# Patient Record
Sex: Female | Born: 2000 | Race: White | Hispanic: No | Marital: Single | Smoking: Current some day smoker
Health system: Southern US, Community
[De-identification: ages and names within clinical notes are randomized; demographics above are authoritative.]

## PROBLEM LIST (undated history)

## (undated) DIAGNOSIS — J45909 Unspecified asthma, uncomplicated: Secondary | ICD-10-CM

## (undated) HISTORY — PX: WISDOM TOOTH EXTRACTION: SHX21

---
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
## 2020-07-12 ENCOUNTER — Emergency Department
Admission: EM | Admit: 2020-07-12 | Discharge: 2020-07-12 | Disposition: A | Discharge status: Home / Self Care | Payer: No Typology Code available for payment source | Source: Home / Self Care

## 2020-07-12 ENCOUNTER — Emergency Department (INDEPENDENT_AMBULATORY_CARE_PROVIDER_SITE_OTHER): Payer: No Typology Code available for payment source

## 2020-07-12 ENCOUNTER — Encounter: Payer: Self-pay | Admitting: Emergency Medicine

## 2020-07-12 ENCOUNTER — Other Ambulatory Visit: Payer: Self-pay

## 2020-07-12 DIAGNOSIS — J069 Acute upper respiratory infection, unspecified: Secondary | ICD-10-CM

## 2020-07-12 DIAGNOSIS — R059 Cough, unspecified: Secondary | ICD-10-CM | POA: Diagnosis not present

## 2020-07-12 DIAGNOSIS — R062 Wheezing: Secondary | ICD-10-CM | POA: Diagnosis not present

## 2020-07-12 DIAGNOSIS — R0602 Shortness of breath: Secondary | ICD-10-CM

## 2020-07-12 HISTORY — DX: Unspecified asthma, uncomplicated: J45.909

## 2020-07-12 MED ORDER — DEXAMETHASONE SODIUM PHOSPHATE 10 MG/ML IJ SOLN
10.0000 mg | Freq: Once | INTRAMUSCULAR | Status: AC
  Administered 2020-07-12: 10 mg via INTRAMUSCULAR

## 2020-07-12 MED ORDER — PREDNISONE 10 MG (21) PO TBPK: ORAL_TABLET | Freq: Every day | ORAL | 0 refills | Status: AC

## 2020-07-12 MED ORDER — ALBUTEROL SULFATE HFA 108 (90 BASE) MCG/ACT IN AERS
2.0000 | INHALATION_SPRAY | Freq: Once | RESPIRATORY_TRACT | Status: AC
  Administered 2020-07-12: 2 via RESPIRATORY_TRACT

## 2020-07-12 MED ORDER — PROMETHAZINE-DM 6.25-15 MG/5ML PO SYRP: 5.0000 mL | ORAL_SOLUTION | Freq: Four times a day (QID) | ORAL | 0 refills | Status: AC | PRN

## 2020-07-12 NOTE — ED Provider Notes (Signed)
[facility]   [id number] 07/12/20 Arrival Time: 1524   CC: COVID symptoms  SUBJECTIVE: History from: patient.  Janice West is a 20 y.o. female who presents with cough x 1 week, SOB. Reports that her sister was sick a week ago and that she thinks she has the same thing. Denies sick exposure to COVID, flu or strep. Denies recent travel. Has positive history of Covid in 04/2019. Has not completed Covid vaccines. Has been taking mucinex with little relief. Reports that she has asthma but is out of albuterol at home. Symptoms are made worse with activity. Denies previous symptoms in the past. Denies fever, chills, fatigue, sinus pain, rhinorrhea, sore throat, wheezing, chest pain, nausea, changes in bowel or bladder habits.    ROS: As per HPI.  All other pertinent ROS negative.     Past Medical History:  Diagnosis Date  . Asthma    Past Surgical History:  Procedure Laterality Date  . WISDOM TOOTH EXTRACTION     No Known Allergies No current facility-administered medications on file prior to encounter.   Current Outpatient Medications on File Prior to Encounter  Medication Sig Dispense Refill  . levonorgestrel (MIRENA) 20 MCG/24HR IUD by Intrauterine route.     Social History   Socioeconomic History  . Marital status: Single    Spouse name: Not on file  . Number of children: Not on file  . Years of education: Not on file  . Highest education level: Not on file  Occupational History  . Not on file  Tobacco Use  . Smoking status: Current Some Day Smoker    Types: E-cigarettes  . Smokeless tobacco: Never Used  Vaping Use  . Vaping Use: Some days  . Substances: Nicotine  Substance and Sexual Activity  . Alcohol use: Not Currently  . Drug use: Yes    Types: Marijuana    Comment: 2 gms /week  . Sexual activity: Not Currently    Birth control/protection: I.U.D.  Other Topics Concern  . Not on file  Social History Narrative  . Not on file   Social Determinants  of Health   Financial Resource Strain: Not on file  Food Insecurity: Not on file  Transportation Needs: Not on file  Physical Activity: Not on file  Stress: Not on file  Social Connections: Not on file  Intimate Partner Violence: Not on file   Family History  Problem Relation Age of Onset  . Healthy Mother   . Healthy Father   . Healthy Sister   . Healthy Brother     OBJECTIVE:  Vitals:   07/12/20 1539 07/12/20 1544  BP: 132/89   Pulse: 80   Resp: 16   Temp: 99.1 F (37.3 C)   TempSrc: Oral   SpO2: 97%   Weight:  95 lb (43.1 kg)  Height:  5\' 3"  (1.6 m)     General appearance: alert; appears fatigued, but nontoxic; speaking in full sentences and tolerating own secretions HEENT: NCAT; Ears: EACs clear, TMs pearly gray; Eyes: PERRL.  EOM grossly intact. Sinuses: nontender; Nose: nares patent with clear rhinorrhea, Throat: oropharynx erythematous, cobblestoning present, tonsils non erythematous or enlarged, uvula midline  Neck: supple without LAD Lungs: unlabored respirations, symmetrical air entry; cough: mild; no respiratory distress; diffuse wheezing throughout bilateral lung fields Heart: regular rate and rhythm.  Radial pulses 2+ symmetrical bilaterally Skin: warm and dry Psychological: alert and cooperative; normal mood and affect  LABS:  No results found for this or any previous  visit (from the past 24 hour(s)).   ASSESSMENT & PLAN:  1. Viral URI with cough   2. SOB (shortness of breath)   3. Wheezing     Meds ordered this encounter  Medications  . dexamethasone (DECADRON) injection 10 mg  . albuterol (VENTOLIN HFA) 108 (90 Base) MCG/ACT inhaler 2 puff  . promethazine-dextromethorphan (PROMETHAZINE-DM) 6.25-15 MG/5ML syrup    Sig: Take 5 mLs by mouth 4 (four) times daily as needed for cough.    Dispense:  118 mL    Refill:  0    Order Specific Question:   Supervising Provider    Answer:   Merrilee Jansky [id number]  . predniSONE (STERAPRED UNI-PAK 21  TAB) 10 MG (21) TBPK tablet    Sig: Take by mouth daily for 6 days. Take 6 tablets on day 1, 5 tablets on day 2, 4 tablets on day 3, 3 tablets on day 4, 2 tablets on day 5, 1 tablet on day 6    Dispense:  21 tablet    Refill:  0    Order Specific Question:   Supervising Provider    Answer:   Merrilee Jansky [[id number]]    Chest xray negative for pneumonia today Decadron 10mg  IM in office today Albuterol inhaler given in office today Promethazine cough syrup prescribed Sedation precautions given Steroid taper prescribed Continue supportive care at home Work/School note provided Get plenty of rest and push fluids Use OTC zyrtec for nasal congestion, runny nose, and/or sore throat Use OTC flonase for nasal congestion and runny nose Use medications daily for symptom relief Use OTC medications like ibuprofen or tylenol as needed fever or pain Call or go to the ED if you have any new or worsening symptoms such as fever, worsening cough, shortness of breath, chest tightness, chest pain, turning blue, changes in mental status.  Reviewed expectations re: course of current medical issues. Questions answered. Outlined signs and symptoms indicating need for more acute intervention. Patient verbalized understanding. After Visit Summary given.         , NP 07/12/20 1627

## 2020-07-12 NOTE — ED Triage Notes (Signed)
Cough x 1 week Sister was sick but is better now DOE per pt  Hx of asthma - no albuterol at home Mucinex daily No COVID vaccine  COVID 12/20

## 2020-07-12 NOTE — Discharge Instructions (Addendum)
Xray is negative for pneumonia today  I have sent in cough syrup for you to take. This medication can make you sleepy. Do not drive while taking this medication.  I have sent in a prednisone taper for you to take for 6 days. 6 tablets on day one, 5 tablets on day two, 4 tablets on day three, 3 tablets on day four, 2 tablets on day five, and 1 tablet on day six.  I have sent in an albuterol inhaler for you to use 2 puffs every 4-6 hours as needed for cough, shortness of breath, wheezing.  Follow up with this office or with primary care if symptoms are persisting.  Follow up in the ER for high fever, trouble swallowing, trouble breathing, other concerning symptoms.

## 2021-05-24 ENCOUNTER — Emergency Department
Admission: RE | Admit: 2021-05-24 | Discharge: 2021-05-24 | Disposition: A | Discharge status: Home / Self Care | Payer: No Typology Code available for payment source | Source: Ambulatory Visit | Attending: Family Medicine | Admitting: Family Medicine

## 2021-05-24 ENCOUNTER — Other Ambulatory Visit: Payer: Self-pay

## 2021-05-24 VITALS — BP 122/87 | HR 78 | Temp 99.0°F | Resp 14

## 2021-05-24 DIAGNOSIS — R062 Wheezing: Secondary | ICD-10-CM

## 2021-05-24 DIAGNOSIS — R059 Cough, unspecified: Secondary | ICD-10-CM | POA: Diagnosis not present

## 2021-05-24 DIAGNOSIS — J101 Influenza due to other identified influenza virus with other respiratory manifestations: Secondary | ICD-10-CM

## 2021-05-24 LAB — POC INFLUENZA A AND B ANTIGEN (URGENT CARE ONLY)
Influenza A Ag: POSITIVE — AB
Influenza B Ag: NEGATIVE

## 2021-05-24 MED ORDER — PREDNISONE 20 MG PO TABS: ORAL_TABLET | ORAL | 0 refills | Status: AC

## 2021-05-24 MED ORDER — BENZONATATE 200 MG PO CAPS: 200.0000 mg | ORAL_CAPSULE | Freq: Three times a day (TID) | ORAL | 0 refills | Status: AC | PRN

## 2021-05-24 MED ORDER — ALBUTEROL SULFATE HFA 108 (90 BASE) MCG/ACT IN AERS: 1.0000 | INHALATION_SPRAY | Freq: Four times a day (QID) | RESPIRATORY_TRACT | 0 refills | Status: AC | PRN

## 2021-05-24 MED ORDER — OSELTAMIVIR PHOSPHATE 75 MG PO CAPS: 75.0000 mg | ORAL_CAPSULE | Freq: Two times a day (BID) | ORAL | 0 refills | Status: AC

## 2021-05-24 NOTE — ED Provider Notes (Signed)
[facility]    CSN: [id number] Arrival date & time: 05/24/21  1435      History   Chief Complaint Chief Complaint  Patient presents with   Cough   Nasal Congestion    HPI Janice West is a 20 y.o. female.   HPI 20 year old female presents with cough, congestion, body aches that began yesterday and reports positive flu exposure.  PMH significant for asthma.  Past Medical History:  Diagnosis Date   Asthma     Patient Active Problem List   Diagnosis Date Noted   Allergic rhinitis 04/13/2009    Past Surgical History:  Procedure Laterality Date   WISDOM TOOTH EXTRACTION      OB History   No obstetric history on file.      Home Medications    Prior to Admission medications   Medication Sig Start Date End Date Taking? Authorizing Provider  albuterol (VENTOLIN HFA) 108 (90 Base) MCG/ACT inhaler Inhale 1-2 puffs into the lungs every 6 (six) hours as needed for wheezing or shortness of breath. 05/24/21  Yes Trevor Iha, FNP  benzonatate (TESSALON) 200 MG capsule Take 1 capsule (200 mg total) by mouth 3 (three) times daily as needed for up to 7 days for cough. 05/24/21 05/31/21 Yes Trevor Iha, FNP  cetirizine (ZYRTEC) 10 MG tablet Take 10 mg by mouth daily.   Yes [provider]  oseltamivir (TAMIFLU) 75 MG capsule Take 1 capsule (75 mg total) by mouth every 12 (twelve) hours. 05/24/21  Yes Trevor Iha, FNP  predniSONE (DELTASONE) 20 MG tablet Take 2 tabs PO daily x 5 days. 05/24/21  Yes Trevor Iha, FNP  levonorgestrel (MIRENA) 20 MCG/24HR IUD by Intrauterine route.    [provider]  promethazine-dextromethorphan (PROMETHAZINE-DM) 6.25-15 MG/5ML syrup Take 5 mLs by mouth 4 (four) times daily as needed for cough. Patient not taking: Reported on 05/24/2021 07/12/20   Moshe Cipro, NP    Family History Family History  Problem Relation Age of Onset   Healthy Mother    Healthy Father    Healthy Sister    Healthy Brother      Social History Social History   Tobacco Use   Smoking status: Some Days    Types: E-cigarettes   Smokeless tobacco: Never  Vaping Use   Vaping Use: Some days   Substances: Nicotine  Substance Use Topics   Alcohol use: Not Currently   Drug use: Yes    Types: Marijuana    Comment: 2 gms /week     Allergies   Patient has no known allergies.   Review of Systems Review of Systems  HENT:  Positive for congestion.   Respiratory:  Positive for cough.   Musculoskeletal:  Positive for myalgias.  All other systems reviewed and are negative.   Physical Exam Triage Vital Signs ED Triage Vitals  Enc Vitals Group     BP 05/24/21 1528 122/87     Pulse Rate 05/24/21 1528 78     Resp 05/24/21 1528 14     Temp 05/24/21 1528 99 F (37.2 C)     Temp Source 05/24/21 1528 Oral     SpO2 05/24/21 1528 99 %     Weight --      Height --      Head Circumference --      Peak Flow --      Pain Score 05/24/21 1530 2     Pain Loc --      Pain Edu? --  Excl. in GC? --    No data found.  Updated Vital Signs BP 122/87 (BP Location: Left Arm)    Pulse 78    Temp 99 F (37.2 C) (Oral)    Resp 14    SpO2 99%    Physical Exam Vitals and nursing note reviewed.  Constitutional:      General: She is not in acute distress.    Appearance: Normal appearance. She is normal weight. She is ill-appearing.  HENT:     Right Ear: Tympanic membrane, ear canal and external ear normal.     Left Ear: Tympanic membrane, ear canal and external ear normal.     Mouth/Throat:     Mouth: Mucous membranes are moist.     Pharynx: Oropharynx is clear.  Eyes:     Extraocular Movements: Extraocular movements intact.     Conjunctiva/sclera: Conjunctivae normal.     Pupils: Pupils are equal, round, and reactive to light.  Cardiovascular:     Rate and Rhythm: Normal rate and regular rhythm.     Pulses: Normal pulses.     Heart sounds: Normal heart sounds.  Pulmonary:     Effort: Pulmonary effort is  normal.     Breath sounds: Wheezing present. No rhonchi or rales.  Musculoskeletal:     Cervical back: Normal range of motion and neck supple. Tenderness present.  Lymphadenopathy:     Cervical: Cervical adenopathy present.  Skin:    General: Skin is warm and dry.  Neurological:     General: No focal deficit present.     Mental Status: She is alert and oriented to person, place, and time.     UC Treatments / Results  Labs (all labs ordered are listed, but only abnormal results are displayed) Labs Reviewed  POC INFLUENZA A AND B ANTIGEN (URGENT CARE ONLY) - Abnormal; Notable for the following components:      Result Value   Influenza A Ag Positive (*)    All other components within normal limits    EKG   Radiology No results found.  Procedures Procedures (including critical care time)  Medications Ordered in UC Medications - No data to display  Initial Impression / Assessment and Plan / UC Course  I have reviewed the triage vital signs and the nursing notes.  Pertinent labs & imaging results that were available during my care of the patient were reviewed by me and considered in my medical decision making (see chart for details).     MDM: 1.  Influenza A-Rx Tamiflu; 2.  Wheezes-Rx'd Prednisone and Albuterol inhaler (added per patient request PMH of asthma); 3.  Cough-Tessalon Perles. Advised patient to take medication with food to completion.  Advised patient to take prednisone with first dose of Tamiflu for the next 5 days.  Advised may use Tessalon Perles daily or as needed for cough.  Encouraged patient to increase daily water intake while taking these medications.  Work note provided per patient request.  Patient discharged home, hemodynamically stable. Final Clinical Impressions(s) / UC Diagnoses   Final diagnoses:  Influenza A  Cough, unspecified type  Wheezes     Discharge Instructions      Advised patient to take medication with food to completion.   Advised patient to take prednisone with first dose of Tamiflu for the next 5 days.  Advised may use Tessalon Perles daily or as needed for cough.  Encouraged patient to increase daily water intake while taking these medications.  ED Prescriptions     Medication Sig Dispense Auth. Provider   oseltamivir (TAMIFLU) 75 MG capsule Take 1 capsule (75 mg total) by mouth every 12 (twelve) hours. 10 capsule Trevor Iha, FNP   predniSONE (DELTASONE) 20 MG tablet Take 2 tabs PO daily x 5 days. 10 tablet Trevor Iha, FNP   benzonatate (TESSALON) 200 MG capsule Take 1 capsule (200 mg total) by mouth 3 (three) times daily as needed for up to 7 days for cough. 40 capsule Trevor Iha, FNP   albuterol (VENTOLIN HFA) 108 (90 Base) MCG/ACT inhaler Inhale 1-2 puffs into the lungs every 6 (six) hours as needed for wheezing or shortness of breath. 1 each Trevor Iha, FNP      PDMP not reviewed this encounter.   Trevor Iha, FNP 05/24/21 1708

## 2021-05-24 NOTE — Discharge Instructions (Addendum)
Advised patient to take medication with food to completion.  Advised patient to take prednisone with first dose of Tamiflu for the next 5 days.  Advised may use Tessalon Perles daily or as needed for cough.  Encouraged patient to increase daily water intake while taking these medications.

## 2021-05-24 NOTE — ED Triage Notes (Signed)
Pt presents with cough, congestion, body aches that began yesterday positive flu exposure

## 2021-12-29 IMAGING — DX DG CHEST 2V
2 series · 2 of 2 positions shown · non-contrast
Comparison: None.

CLINICAL DATA: Cough and short of breath

EXAM:
CHEST - 2 VIEW

[chest pa]
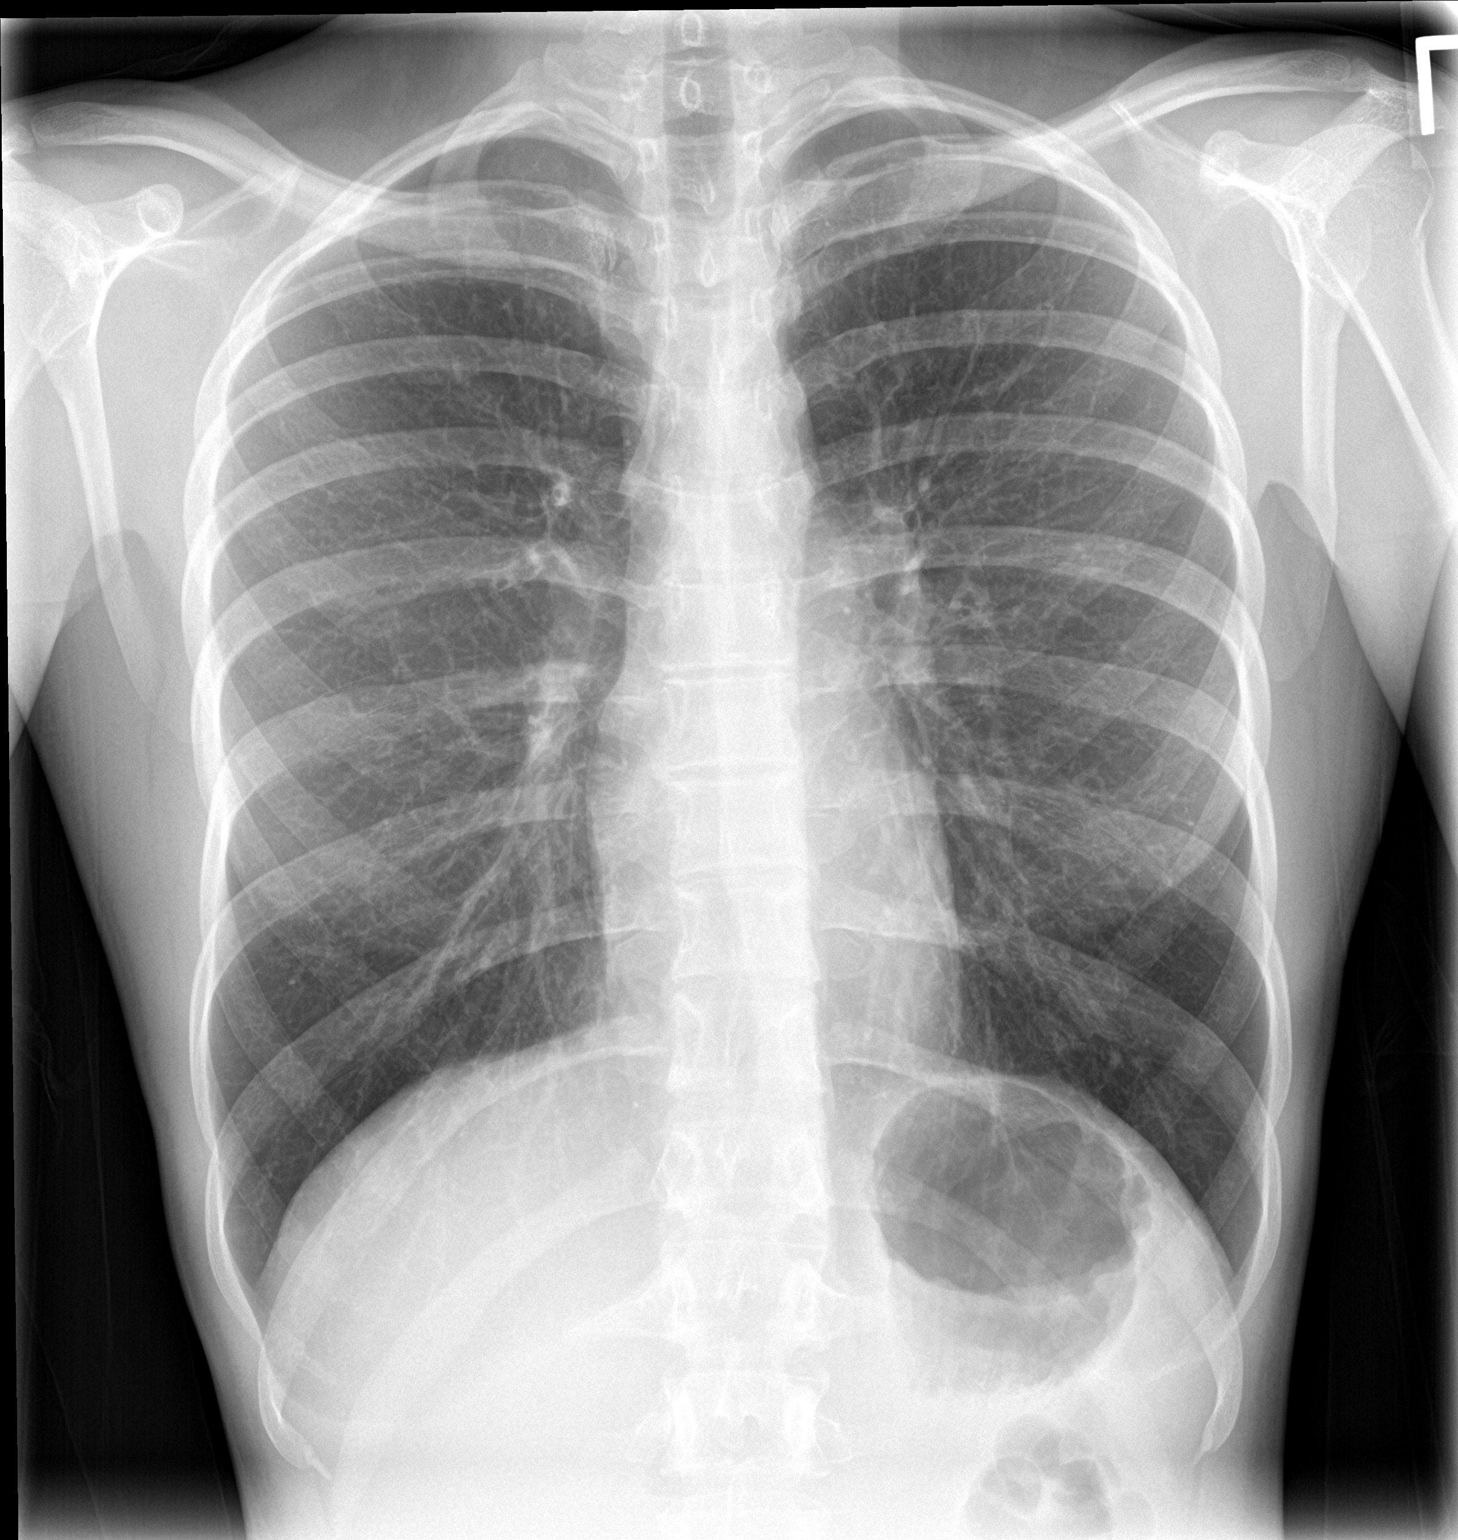

[chest lat]
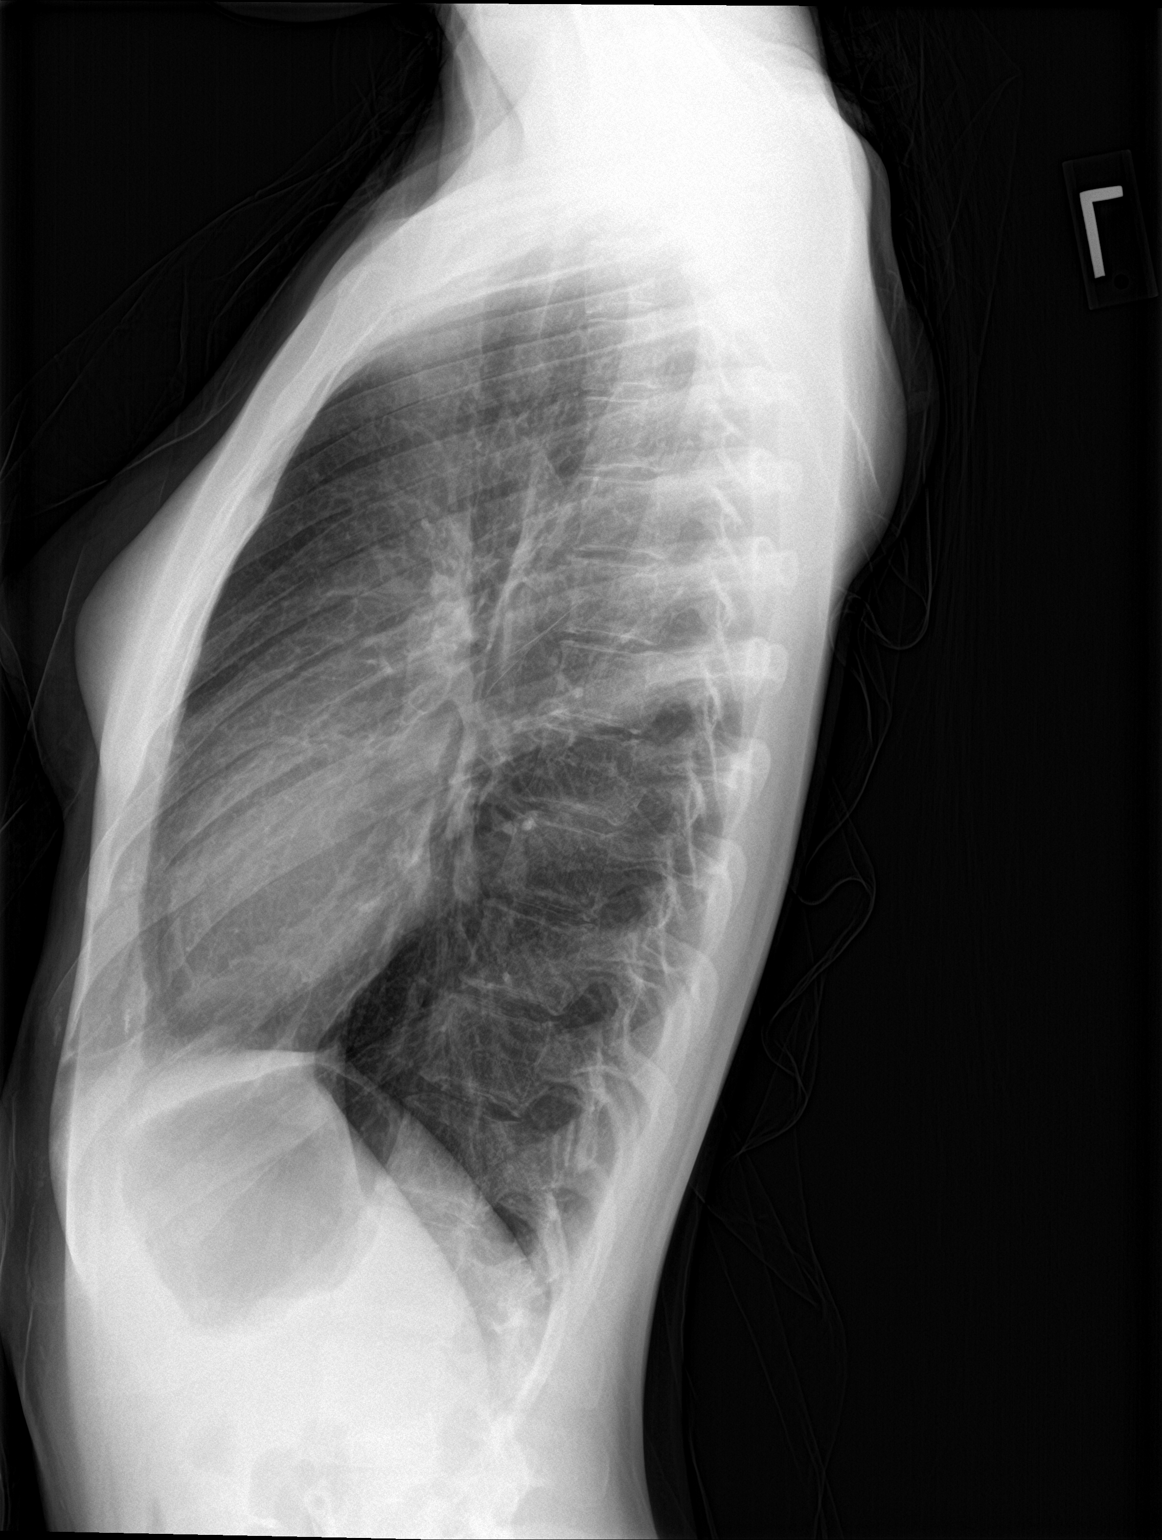

[2 of 2 positions shown; findings below may reference images not displayed]

FINDINGS: The heart size and mediastinal contours are within normal limits.
Both lungs are clear. The visualized skeletal structures are
unremarkable.
IMPRESSION: No active cardiopulmonary disease.
